# Patient Record
Sex: Female | Born: 1955 | Race: White | Hispanic: No | Marital: Married | Smoking: Never smoker
Health system: Southern US, Community
[De-identification: ages and names within clinical notes are randomized; demographics above are authoritative.]

## PROBLEM LIST (undated history)

## (undated) DIAGNOSIS — E119 Type 2 diabetes mellitus without complications: Secondary | ICD-10-CM

## (undated) DIAGNOSIS — E785 Hyperlipidemia, unspecified: Secondary | ICD-10-CM

## (undated) DIAGNOSIS — G709 Myoneural disorder, unspecified: Secondary | ICD-10-CM

## (undated) DIAGNOSIS — C801 Malignant (primary) neoplasm, unspecified: Secondary | ICD-10-CM

## (undated) HISTORY — PX: MASTECTOMY WITH AXILLARY LYMPH NODE DISSECTION: SHX5661

---

## 2004-12-11 ENCOUNTER — Ambulatory Visit: Payer: Self-pay | Admitting: Family Medicine

## 2005-01-31 ENCOUNTER — Ambulatory Visit: Payer: Self-pay | Admitting: Unknown Physician Specialty

## 2005-10-07 ENCOUNTER — Ambulatory Visit: Payer: Self-pay | Admitting: Family Medicine

## 2006-06-24 HISTORY — PX: MASTECTOMY: SHX3

## 2009-04-28 ENCOUNTER — Emergency Department: Payer: Self-pay | Admitting: Internal Medicine

## 2010-02-15 ENCOUNTER — Ambulatory Visit: Payer: Self-pay | Admitting: Unknown Physician Specialty

## 2015-02-09 ENCOUNTER — Encounter: Payer: Self-pay | Admitting: *Deleted

## 2015-02-10 ENCOUNTER — Ambulatory Visit
Admission: RE | Admit: 2015-02-10 | Discharge: 2015-02-10 | Disposition: A | Discharge status: Home / Self Care | Payer: Managed Care, Other (non HMO) | Source: Ambulatory Visit | Attending: Unknown Physician Specialty | Admitting: Unknown Physician Specialty

## 2015-02-10 ENCOUNTER — Ambulatory Visit: Payer: Managed Care, Other (non HMO) | Admitting: Anesthesiology

## 2015-02-10 ENCOUNTER — Encounter
Admission: RE | Disposition: A | Discharge status: Home / Self Care | Payer: Self-pay | Source: Ambulatory Visit | Attending: Unknown Physician Specialty

## 2015-02-10 ENCOUNTER — Encounter: Payer: Self-pay | Admitting: Anesthesiology

## 2015-02-10 DIAGNOSIS — E785 Hyperlipidemia, unspecified: Secondary | ICD-10-CM | POA: Insufficient documentation

## 2015-02-10 DIAGNOSIS — Z8371 Family history of colonic polyps: Secondary | ICD-10-CM | POA: Diagnosis not present

## 2015-02-10 DIAGNOSIS — Z1211 Encounter for screening for malignant neoplasm of colon: Secondary | ICD-10-CM | POA: Insufficient documentation

## 2015-02-10 DIAGNOSIS — I252 Old myocardial infarction: Secondary | ICD-10-CM | POA: Insufficient documentation

## 2015-02-10 DIAGNOSIS — E119 Type 2 diabetes mellitus without complications: Secondary | ICD-10-CM | POA: Insufficient documentation

## 2015-02-10 DIAGNOSIS — K621 Rectal polyp: Secondary | ICD-10-CM | POA: Diagnosis not present

## 2015-02-10 DIAGNOSIS — Z859 Personal history of malignant neoplasm, unspecified: Secondary | ICD-10-CM | POA: Insufficient documentation

## 2015-02-10 DIAGNOSIS — G709 Myoneural disorder, unspecified: Secondary | ICD-10-CM | POA: Diagnosis not present

## 2015-02-10 DIAGNOSIS — D123 Benign neoplasm of transverse colon: Secondary | ICD-10-CM | POA: Insufficient documentation

## 2015-02-10 DIAGNOSIS — K640 First degree hemorrhoids: Secondary | ICD-10-CM | POA: Insufficient documentation

## 2015-02-10 HISTORY — DX: Hyperlipidemia, unspecified: E78.5

## 2015-02-10 HISTORY — PX: COLONOSCOPY WITH PROPOFOL: SHX5780

## 2015-02-10 HISTORY — DX: Malignant (primary) neoplasm, unspecified: C80.1

## 2015-02-10 HISTORY — DX: Type 2 diabetes mellitus without complications: E11.9

## 2015-02-10 HISTORY — DX: Myoneural disorder, unspecified: G70.9

## 2015-02-10 LAB — GLUCOSE, CAPILLARY: GLUCOSE-CAPILLARY: 99 mg/dL (ref 65–99)

## 2015-02-10 SURGERY — COLONOSCOPY WITH PROPOFOL
Anesthesia: General

## 2015-02-10 MED ORDER — SODIUM CHLORIDE 0.9 % IV SOLN
INTRAVENOUS | Status: DC
  Administered 2015-02-10: 1000 mL via INTRAVENOUS

## 2015-02-10 MED ORDER — PROPOFOL 10 MG/ML IV BOLUS
INTRAVENOUS | Status: DC | PRN
  Administered 2015-02-10: 80 mg via INTRAVENOUS
  Administered 2015-02-10: 30 mg via INTRAVENOUS

## 2015-02-10 MED ORDER — LIDOCAINE HCL (CARDIAC) 20 MG/ML IV SOLN
INTRAVENOUS | Status: DC | PRN
  Administered 2015-02-10: 20 mg via INTRAVENOUS

## 2015-02-10 MED ORDER — SODIUM CHLORIDE 0.9 % IV SOLN: INTRAVENOUS | Status: DC

## 2015-02-10 MED ORDER — PROPOFOL INFUSION 10 MG/ML OPTIME
INTRAVENOUS | Status: DC | PRN
  Administered 2015-02-10: 120 ug/kg/min via INTRAVENOUS

## 2015-02-10 NOTE — Transfer of Care (Signed)
Immediate Anesthesia Transfer of Care Note  Patient: Jessica Conner  Procedure(s) Performed: Procedure(s): COLONOSCOPY WITH PROPOFOL (N/A)  Patient Location: Endoscopy Unit  Anesthesia Type:General  Level of Consciousness: awake  Airway & Oxygen Therapy: Patient Spontanous Breathing and Patient connected to nasal cannula oxygen  Post-op Assessment: Report given to RN and Post -op Vital signs reviewed and stable  Post vital signs: Reviewed and stable  Last Vitals:  Filed Vitals:   02/10/15 0749  BP: 166/98  Pulse: 22  Temp: 37.3 C  Resp: 22    Complications: No apparent anesthesia complications

## 2015-02-10 NOTE — Op Note (Signed)
[facility] Gastroenterology Patient Name: Jessica Conner Procedure Date: 02/10/2015 8:14 AM MRN: [id number] Account #: [id number] Date of Birth: July 26, 1955 Admit Type: Outpatient Age: 59 Room: [facility] ENDO ROOM 1 Gender: Female Note Status: Finalized Procedure:         Colonoscopy Indications:       Colon cancer screening in patient at increased risk:                     Family history of colon polyps Providers:         Manya Silvas, MD Referring MD:      Lenard Simmer, MD (Referring MD) Medicines:         Propofol per Anesthesia Complications:     No immediate complications. Procedure:         Pre-Anesthesia Assessment:                    - After reviewing the risks and benefits, the patient was                     deemed in satisfactory condition to undergo the procedure.                    After obtaining informed consent, the colonoscope was                     passed under direct vision. Throughout the procedure, the                     patient's blood pressure, pulse, and oxygen saturations                     were monitored continuously. The Colonoscope was                     introduced through the anus and advanced to the the cecum,                     identified by appendiceal orifice and ileocecal valve. The                     colonoscopy was performed without difficulty. The patient                     tolerated the procedure well. The quality of the bowel                     preparation was good. Findings:      A diminutive polyp was found in the rectum. The polyp was sessile. The       polyp was removed with a jumbo cold forceps. Resection and retrieval       were complete.      Internal hemorrhoids were found during endoscopy. The hemorrhoids were       small and Grade I (internal hemorrhoids that do not prolapse).      The exam was otherwise without abnormality. Impression:        - One diminutive polyp in the rectum. Resected and                  retrieved.                    - Internal hemorrhoids.                    -  The examination was otherwise normal. Recommendation:    - Await pathology results. Manya Silvas, MD 02/10/2015 8:48:38 AM This report has been signed electronically. Number of Addenda: 0 Note Initiated On: 02/10/2015 8:14 AM Scope Withdrawal Time: 0 hours 8 minutes 57 seconds  Total Procedure Duration: 0 hours 14 minutes 34 seconds       [facility]

## 2015-02-10 NOTE — H&P (Signed)
   Primary Care Physician:  Lenard Simmer, MD Primary Gastroenterologist:  Dr. Vira Agar  Pre-Procedure History & Physical: HPI:  Jessica Conner is a 59 y.o. female is here for an colonoscopy.   Past Medical History  Diagnosis Date  . Diabetes mellitus without complication   . Hyperlipidemia   . Neuromuscular disorder   . Cancer     History reviewed. No pertinent past surgical history.  Prior to Admission medications   Medication Sig Start Date End Date Taking? Authorizing Provider  anastrozole (ARIMIDEX) 1 MG tablet Take 1 mg by mouth daily.   Yes Historical Provider, MD  gabapentin (NEURONTIN) 600 MG tablet Take 600 mg by mouth 2 (two) times daily.   Yes Historical Provider, MD  metFORMIN (GLUMETZA) 500 MG (MOD) 24 hr tablet Take 500 mg by mouth daily with breakfast.   Yes Historical Provider, MD  rosuvastatin (CRESTOR) 5 MG tablet Take 5 mg by mouth daily.   Yes Historical Provider, MD    Allergies as of 12/29/2014  . (Not on File)    History reviewed. No pertinent family history.  Social History   Social History  . Marital Status: Married    Spouse Name: N/A  . Number of Children: N/A  . Years of Education: N/A   Occupational History  . Not on file.   Social History Main Topics  . Smoking status: Not on file  . Smokeless tobacco: Not on file  . Alcohol Use: Not on file  . Drug Use: Not on file  . Sexual Activity: Not on file   Other Topics Concern  . Not on file   Social History Narrative    Review of Systems: See HPI, otherwise negative ROS  Physical Exam: BP 166/98 mmHg  Pulse 22  Temp(Src) 99.1 F (37.3 C) (Tympanic)  Resp 22  Ht 5\' 4"  (1.626 m)  Wt 83.915 kg (185 lb)  BMI 31.74 kg/m2  SpO2 98% General:   Alert,  pleasant and cooperative in NAD Head:  Normocephalic and atraumatic. Neck:  Supple; no masses or thyromegaly. Lungs:  Clear throughout to auscultation.    Heart:  Regular rate and rhythm. Abdomen:  Soft, nontender and  nondistended. Normal bowel sounds, without guarding, and without rebound.   Neurologic:  Alert and  oriented x4;  grossly normal neurologically.  Impression/Plan: Jessica Conner is here for an colonoscopy to be performed for FH colon polyp in brother  Risks, benefits, limitations, and alternatives regarding  colonoscopy have been reviewed with the patient.  Questions have been answered.  All parties agreeable.   Gaylyn Cheers, MD  02/10/2015, 8:24 AM

## 2015-02-10 NOTE — Anesthesia Preprocedure Evaluation (Addendum)
Anesthesia Evaluation  Patient identified by MRN, date of birth, ID band Patient awake    Reviewed: Allergy & Precautions, H&P , NPO status , Patient's Chart, lab work & pertinent test results  History of Anesthesia Complications Negative for: history of anesthetic complications  Airway Mallampati: III  TM Distance: >3 FB Neck ROM: limited    Dental  (+) Poor Dentition   Pulmonary neg pulmonary ROS,  breath sounds clear to auscultation  Pulmonary exam normal       Cardiovascular Exercise Tolerance: Good - Past MI Normal cardiovascular examRhythm:regular Rate:Normal     Neuro/Psych  Neuromuscular disease negative psych ROS   GI/Hepatic negative GI ROS, Neg liver ROS,   Endo/Other  diabetes, Type 2, Oral Hypoglycemic Agents  Renal/GU negative Renal ROS  negative genitourinary   Musculoskeletal   Abdominal   Peds  Hematology negative hematology ROS (+)   Anesthesia Other Findings Past Medical History:   Diabetes mellitus without complication                       Hyperlipidemia                                               Neuromuscular disorder                                       Cancer                                                       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   Dental Advisory Given  Plan Discussed  with: Anesthesiologist and Surgeon  Anesthesia Plan Comments:        Anesthesia Quick Evaluation

## 2015-02-10 NOTE — Anesthesia Postprocedure Evaluation (Signed)
  Anesthesia Post-op Note  Patient: Jessica Conner  Procedure(s) Performed: Procedure(s): COLONOSCOPY WITH PROPOFOL (N/A)  Anesthesia type:General  Patient location: PACU  Post pain: Pain level controlled  Post assessment: Post-op Vital signs reviewed, Patient's Cardiovascular Status Stable, Respiratory Function Stable, Patent Airway and No signs of Nausea or vomiting  Post vital signs: Reviewed and stable  Last Vitals:  Filed Vitals:   02/10/15 0915  BP: 124/81  Pulse: 68  Temp:   Resp: 16    Level of consciousness: awake, alert  and patient cooperative  Complications: No apparent anesthesia complications

## 2015-02-11 NOTE — Progress Notes (Signed)
Non-identifying voicemail.  No message left.

## 2015-02-13 ENCOUNTER — Encounter: Payer: Self-pay | Admitting: Unknown Physician Specialty

## 2015-02-13 LAB — SURGICAL PATHOLOGY

## 2015-04-06 ENCOUNTER — Encounter
Admission: RE | Admit: 2015-04-06 | Discharge: 2015-04-06 | Disposition: A | Discharge status: Home / Self Care | Payer: Managed Care, Other (non HMO) | Source: Ambulatory Visit | Attending: Specialist | Admitting: Specialist

## 2015-04-06 DIAGNOSIS — Z01818 Encounter for other preprocedural examination: Secondary | ICD-10-CM | POA: Insufficient documentation

## 2015-04-06 DIAGNOSIS — G5601 Carpal tunnel syndrome, right upper limb: Secondary | ICD-10-CM | POA: Diagnosis not present

## 2015-04-06 NOTE — Patient Instructions (Signed)
  Your procedure is scheduled on: Tuesday April 11, 2015. Report to Same Day Surgery. To find out your arrival time please call [phone] between 1PM - 3PM on Monday April 10, 2015.  Remember: Instructions that are not followed completely may result in serious medical risk, up to and including death, or upon the discretion of your surgeon and anesthesiologist your surgery may need to be rescheduled.    _x___ 1. Do not eat food or drink liquids after midnight. No gum chewing or hard candies.     ____ 2. No Alcohol for 24 hours before or after surgery.   ____ 3. Bring all medications with you on the day of surgery if instructed.    _x___ 4. Notify your doctor if there is any change in your medical condition     (cold, fever, infections).     Do not wear jewelry, make-up, hairpins, clips or nail polish.  Do not wear lotions, powders, or perfumes. You may wear deodorant.  Do not shave 48 hours prior to surgery. Men may shave face and neck.  Do not bring valuables to the hospital.    [facility] is not responsible for any belongings or valuables.               Contacts, dentures or bridgework may not be worn into surgery.  Leave your suitcase in the car. After surgery it may be brought to your room.  For patients admitted to the hospital, discharge time is determined by your treatment team.   Patients discharged the day of surgery will not be allowed to drive home.    Please read over the following fact sheets that you were given:   [facility] Preparing for Surgery  _x___ Take these medicines the morning of surgery with A SIP OF WATER:    1. anastrozole (ARIMIDEX)   2. gabapentin (NEURONTIN)    ____ Fleet Enema (as directed)   _x___ Use CHG Soap as directed  ____ Use inhalers on the day of surgery  _x___ Stop metformin 2 days prior to surgery, last dose on Saturday.    ____ Take 1/2 of usual insulin dose the night before surgery and none on the morning of surgery.    ____ Stop Coumadin/Plavix/aspirin on does not apply.  _x___ Stop Anti-inflammatories(Aleve) on now.  Tylenol can be taken for pain.   ____ Stop supplements until after surgery.    ____ Bring C-Pap to the hospital.

## 2015-04-10 DIAGNOSIS — E119 Type 2 diabetes mellitus without complications: Secondary | ICD-10-CM | POA: Diagnosis not present

## 2015-04-10 DIAGNOSIS — G5601 Carpal tunnel syndrome, right upper limb: Secondary | ICD-10-CM | POA: Diagnosis present

## 2015-04-10 DIAGNOSIS — Z7984 Long term (current) use of oral hypoglycemic drugs: Secondary | ICD-10-CM | POA: Diagnosis not present

## 2015-04-10 DIAGNOSIS — E785 Hyperlipidemia, unspecified: Secondary | ICD-10-CM | POA: Diagnosis not present

## 2015-04-11 ENCOUNTER — Encounter
Admission: RE | Disposition: A | Discharge status: Home / Self Care | Payer: Self-pay | Source: Ambulatory Visit | Attending: Specialist

## 2015-04-11 ENCOUNTER — Ambulatory Visit: Payer: Self-pay | Admitting: Specialist

## 2015-04-11 ENCOUNTER — Ambulatory Visit
Admission: RE | Admit: 2015-04-11 | Discharge: 2015-04-11 | Disposition: A | Discharge status: Home / Self Care | Payer: Managed Care, Other (non HMO) | Source: Ambulatory Visit | Attending: Specialist | Admitting: Specialist

## 2015-04-11 ENCOUNTER — Ambulatory Visit: Payer: Managed Care, Other (non HMO) | Admitting: Anesthesiology

## 2015-04-11 ENCOUNTER — Encounter: Payer: Self-pay | Admitting: *Deleted

## 2015-04-11 DIAGNOSIS — E785 Hyperlipidemia, unspecified: Secondary | ICD-10-CM | POA: Insufficient documentation

## 2015-04-11 DIAGNOSIS — G5601 Carpal tunnel syndrome, right upper limb: Secondary | ICD-10-CM | POA: Diagnosis not present

## 2015-04-11 DIAGNOSIS — Z7984 Long term (current) use of oral hypoglycemic drugs: Secondary | ICD-10-CM | POA: Insufficient documentation

## 2015-04-11 DIAGNOSIS — E119 Type 2 diabetes mellitus without complications: Secondary | ICD-10-CM | POA: Insufficient documentation

## 2015-04-11 HISTORY — PX: CARPAL TUNNEL RELEASE: SHX101

## 2015-04-11 LAB — GLUCOSE, CAPILLARY
Glucose-Capillary: 105 mg/dL — ABNORMAL HIGH (ref 65–99)
Glucose-Capillary: 109 mg/dL — ABNORMAL HIGH (ref 65–99)

## 2015-04-11 SURGERY — CARPAL TUNNEL RELEASE
Anesthesia: General | Site: Hand | Laterality: Right | Wound class: Clean

## 2015-04-11 MED ORDER — OXYCODONE HCL 5 MG PO TABS: 5.0000 mg | ORAL_TABLET | Freq: Once | ORAL | Status: DC | PRN

## 2015-04-11 MED ORDER — LIDOCAINE HCL (CARDIAC) 20 MG/ML IV SOLN
INTRAVENOUS | Status: DC | PRN
Start: 2015-04-11 — End: 2015-04-11
  Administered 2015-04-11: 100 mg via INTRAVENOUS

## 2015-04-11 MED ORDER — BUPIVACAINE HCL 0.5 % IJ SOLN
INTRAMUSCULAR | Status: DC | PRN
  Administered 2015-04-11: 6 mL

## 2015-04-11 MED ORDER — SODIUM CHLORIDE 0.9 % IV SOLN
INTRAVENOUS | Status: DC
Start: 2015-04-11 — End: 2015-04-11
  Administered 2015-04-11: 07:00:00 via INTRAVENOUS

## 2015-04-11 MED ORDER — FENTANYL CITRATE (PF) 100 MCG/2ML IJ SOLN
INTRAMUSCULAR | Status: DC | PRN
  Administered 2015-04-11 (×2): 25 ug via INTRAVENOUS

## 2015-04-11 MED ORDER — ACETAMINOPHEN 10 MG/ML IV SOLN
INTRAVENOUS | Status: AC
  Filled 2015-04-11: qty 100

## 2015-04-11 MED ORDER — MIDAZOLAM HCL 2 MG/2ML IJ SOLN
INTRAMUSCULAR | Status: DC | PRN
  Administered 2015-04-11: 2 mg via INTRAVENOUS

## 2015-04-11 MED ORDER — ONDANSETRON HCL 4 MG/2ML IJ SOLN
INTRAMUSCULAR | Status: DC | PRN
  Administered 2015-04-11: 4 mg via INTRAVENOUS

## 2015-04-11 MED ORDER — TRAMADOL HCL 50 MG PO TABS: 50.0000 mg | ORAL_TABLET | Freq: Four times a day (QID) | ORAL | Status: AC | PRN

## 2015-04-11 MED ORDER — PROPOFOL 10 MG/ML IV BOLUS
INTRAVENOUS | Status: DC | PRN
  Administered 2015-04-11: 180 mg via INTRAVENOUS

## 2015-04-11 MED ORDER — ACETAMINOPHEN 10 MG/ML IV SOLN
INTRAVENOUS | Status: DC | PRN
  Administered 2015-04-11: 1000 mg via INTRAVENOUS

## 2015-04-11 MED ORDER — FAMOTIDINE 20 MG PO TABS
20.0000 mg | ORAL_TABLET | Freq: Once | ORAL | Status: AC
  Administered 2015-04-11: 20 mg via ORAL

## 2015-04-11 MED ORDER — OXYCODONE HCL 5 MG/5ML PO SOLN: 5.0000 mg | Freq: Once | ORAL | Status: DC | PRN

## 2015-04-11 MED ORDER — PHENYLEPHRINE HCL 10 MG/ML IJ SOLN
INTRAMUSCULAR | Status: DC | PRN
  Administered 2015-04-11 (×2): 100 ug via INTRAVENOUS

## 2015-04-11 MED ORDER — FAMOTIDINE 20 MG PO TABS
ORAL_TABLET | ORAL | Status: AC
  Administered 2015-04-11: 20 mg via ORAL
  Filled 2015-04-11: qty 1

## 2015-04-11 MED ORDER — FENTANYL CITRATE (PF) 100 MCG/2ML IJ SOLN: 25.0000 ug | INTRAMUSCULAR | Status: DC | PRN

## 2015-04-11 MED ORDER — BUPIVACAINE HCL (PF) 0.5 % IJ SOLN
INTRAMUSCULAR | Status: AC
  Filled 2015-04-11: qty 30

## 2015-04-11 SURGICAL SUPPLY — 22 items
BANDAGE ELASTIC 3 CLIP NS LF (GAUZE/BANDAGES/DRESSINGS) ×3 IMPLANT
BNDG ESMARK 4X12 TAN STRL LF (GAUZE/BANDAGES/DRESSINGS) ×3 IMPLANT
CANISTER SUCT 1200ML W/VALVE (MISCELLANEOUS) ×3 IMPLANT
CAST PADDING 3X4FT ST 30246 (SOFTGOODS) ×4
CHLORAPREP W/TINT 26ML (MISCELLANEOUS) ×3 IMPLANT
GAUZE PETRO XEROFOAM 1X8 (MISCELLANEOUS) ×3 IMPLANT
GAUZE SPONGE 4X4 12PLY STRL (GAUZE/BANDAGES/DRESSINGS) ×3 IMPLANT
GLOVE BIO SURGEON STRL SZ7.5 (GLOVE) ×3 IMPLANT
GOWN STRL REUS W/ TWL LRG LVL3 (GOWN DISPOSABLE) ×2 IMPLANT
GOWN STRL REUS W/TWL LRG LVL3 (GOWN DISPOSABLE) ×4
KIT RM TURNOVER STRD PROC AR (KITS) ×3 IMPLANT
NS IRRIG 500ML POUR BTL (IV SOLUTION) ×3 IMPLANT
PACK EXTREMITY ARMC (MISCELLANEOUS) ×3 IMPLANT
PAD CAST CTTN 3X4 STRL (SOFTGOODS) ×2 IMPLANT
PAD GROUND ADULT SPLIT (MISCELLANEOUS) ×3 IMPLANT
PAD PREP 24X41 OB/GYN DISP (PERSONAL CARE ITEMS) ×3 IMPLANT
PADDING CAST 3IN STRL (MISCELLANEOUS) ×2
PADDING CAST BLEND 3X4 STRL (MISCELLANEOUS) ×1 IMPLANT
STOCKINETTE STRL 4IN 9604848 (GAUZE/BANDAGES/DRESSINGS) ×3 IMPLANT
SUT ETHILON 4-0 (SUTURE) ×2
SUT ETHILON 4-0 FS2 18XMFL BLK (SUTURE) ×1
SUTURE ETHLN 4-0 FS2 18XMF BLK (SUTURE) ×1 IMPLANT

## 2015-04-11 NOTE — Op Note (Signed)
NAME:  Jessica Conner, Jessica Conner NO.:  [id number]  MEDICAL RECORD NO.:  [id number]  LOCATION:  [facility]                         FACILITY:  [facility]  PHYSICIAN:  Margaretmary Eddy, MD        DATE OF BIRTH:  10/30/1955  DATE OF PROCEDURE:  04/11/2015 DATE OF DISCHARGE:                              OPERATIVE REPORT   PREOPERATIVE DIAGNOSIS:  Right carpal tunnel syndrome.  POSTOPERATIVE DIAGNOSIS:  Right carpal tunnel syndrome.  PROCEDURE:  Right carpal tunnel release.  SURGEON:  Margaretmary Eddy, M.D.  ANESTHESIA:  General.  COMPLICATIONS:  None.  TOURNIQUET TIME:  11 minute.  DESCRIPTION OF PROCEDURE:  After adequate induction of general anesthesia, the right upper extremity was thoroughly prepped with alcohol and ChloraPrep and draped in standard sterile fashion. Extremity was wrapped out with the Esmarch bandage and pneumatic tourniquet elevated to 200 mmHg.  Under loupe magnification, standard volar carpal tunnel incision was made and dissection carried down to the transverse retinacular ligament.  This was incised in the midportion using the knife.  The distal release was performed with the small scissors.  The proximal release is performed with the small scissors and the carpal tunnel scissors.  Careful check is made both proximally and distally to ensure the complete release had been obtained, there is seen to be moderate compression of the nerve directly beneath the ligament. The wound is thoroughly irrigated multiple times.  Skin edges were infiltrated with 0.5% plain Marcaine.  Skin was closed with 4-0 nylon. Soft bulky dressing is applied.  Tourniquet was released and was immediately removed from the arm.  The patient is returned to the recovery room in satisfactory condition having tolerated the procedure quite well.          ______________________________ Margaretmary Eddy, MD     CS/MEDQ  D:  04/11/2015  T:  04/11/2015  Job:  [id number]

## 2015-04-11 NOTE — Discharge Instructions (Signed)
Elevate arm at all times May remove entire bandage in 24 hours, bathe, get wet, etc. Cover wound with bandaid, wear brace prn. AMBULATORY SURGERY  DISCHARGE INSTRUCTIONS   1) The drugs that you were given will stay in your system until tomorrow so for the next 24 hours you should not:  A) Drive an automobile B) Make any legal decisions C) Drink any alcoholic beverage   2) You may resume regular meals tomorrow.  Today it is better to start with liquids and gradually work up to solid foods.  You may eat anything you prefer, but it is better to start with liquids, then soup and crackers, and gradually work up to solid foods.   3) Please notify your doctor immediately if you have any unusual bleeding, trouble breathing, redness and pain at the surgery site, drainage, fever, or pain not relieved by medication.    4) Additional Instructions:  Ice for comfort Please contact your physician with any problems or Same Day Surgery at [phone], Monday through Friday 6 am to 4 pm, or [place] at [facility] number at [phone].

## 2015-04-11 NOTE — H&P (Signed)
  59 year old female with right carpal tunnel syndrome.  History and physical exam has been inserted into the record as a paper document.  Heart and lungs clear.  ENT normal.  Plan: right carpal tunnel release.

## 2015-04-11 NOTE — Addendum Note (Signed)
Addendum  created 04/11/15 1338 by Demetrius Charity, CRNA   Modules edited: Anesthesia Medication Administration

## 2015-04-11 NOTE — Anesthesia Postprocedure Evaluation (Signed)
  Anesthesia Post-op Note  Patient: Jessica Conner  Procedure(s) Performed: Procedure(s): CARPAL TUNNEL RELEASE (Right)  Anesthesia type:General LMA  Patient location: PACU  Post pain: Pain level controlled  Post assessment: Post-op Vital signs reviewed, Patient's Cardiovascular Status Stable, Respiratory Function Stable, Patent Airway and No signs of Nausea or vomiting  Post vital signs: Reviewed and stable  Last Vitals:  Filed Vitals:   04/11/15 0920  BP: 126/74  Pulse: 77  Temp:   Resp: 16    Level of consciousness: awake, alert  and patient cooperative  Complications: No apparent anesthesia complications

## 2015-04-11 NOTE — Brief Op Note (Signed)
04/11/2015  8:13 AM  PATIENT:  Weston Settle  59 y.o. female  PRE-OPERATIVE DIAGNOSIS:  CARPAL TUNNEL SYNDROME right hand  POST-OPERATIVE DIAGNOSIS:  carpal tunnel syndrome right hand  PROCEDURE:  Procedure(s): CARPAL TUNNEL RELEASE (Right)  SURGEON:  Surgeon(s) and Role:    * Christophe Louis, MD - Primary  PHYSICIAN ASSISTANT:   ASSISTANTS: none   ANESTHESIA:   general  EBL:  Total I/O In: 400 [I.V.:400] Out: -   BLOOD ADMINISTERED:none  DRAINS: none   LOCAL MEDICATIONS USED:  MARCAINE     SPECIMEN:  No Specimen  DISPOSITION OF SPECIMEN:  N/A  COUNTS:  YES  TOURNIQUET:    DICTATION: .Dragon Dictation  PLAN OF CARE: Discharge to home after PACU  PATIENT DISPOSITION:  PACU - hemodynamically stable.   Delay start of Pharmacological VTE agent (>24hrs) due to surgical blood loss or risk of bleeding: not applicable

## 2015-04-11 NOTE — Anesthesia Preprocedure Evaluation (Signed)
Anesthesia Evaluation  Patient identified by MRN, date of birth, ID band Patient awake    Reviewed: Allergy & Precautions, H&P , NPO status , Patient's Chart, lab work & pertinent test results  History of Anesthesia Complications Negative for: history of anesthetic complications  Airway Mallampati: III  TM Distance: >3 FB Neck ROM: limited    Dental  (+) Poor Dentition   Pulmonary neg pulmonary ROS,    Pulmonary exam normal breath sounds clear to auscultation       Cardiovascular Exercise Tolerance: Good (-) Past MI Normal cardiovascular exam Rhythm:regular Rate:Normal     Neuro/Psych  Neuromuscular disease negative psych ROS   GI/Hepatic negative GI ROS, Neg liver ROS, neg GERD  ,  Endo/Other  diabetes, Type 2, Oral Hypoglycemic Agents  Renal/GU negative Renal ROS  negative genitourinary   Musculoskeletal   Abdominal   Peds  Hematology negative hematology ROS (+)   Anesthesia Other Findings Past Medical History:   Diabetes mellitus without complication                       Hyperlipidemia                                               Neuromuscular disorder                                       Cancer                                                      BMI    Body Mass Index   32.59 kg/m 2     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   Dental Advisory Given  Plan Discussed with: Anesthesiologist, CRNA and Surgeon  Anesthesia Plan Comments:         Anesthesia Quick Evaluation

## 2015-04-11 NOTE — Transfer of Care (Signed)
Immediate Anesthesia Transfer of Care Note  Patient: Jessica Conner  Procedure(s) Performed: Procedure(s): CARPAL TUNNEL RELEASE (Right)  Patient Location: PACU  Anesthesia Type:General  Level of Consciousness: sedated  Airway & Oxygen Therapy: Patient Spontanous Breathing and Patient connected to face mask oxygen  Post-op Assessment: Report given to RN and Post -op Vital signs reviewed and stable  Post vital signs: Reviewed and stable  Last Vitals:  Filed Vitals:   04/11/15 0624  BP: 137/84  Pulse: 86  Temp: 36.9 C  Resp: 16    Complications: No apparent anesthesia complications

## 2016-07-17 ENCOUNTER — Other Ambulatory Visit: Payer: Self-pay | Admitting: Obstetrics & Gynecology

## 2016-07-17 DIAGNOSIS — Z13820 Encounter for screening for osteoporosis: Secondary | ICD-10-CM

## 2016-07-17 DIAGNOSIS — Z1231 Encounter for screening mammogram for malignant neoplasm of breast: Secondary | ICD-10-CM

## 2016-09-04 ENCOUNTER — Telehealth: Payer: Self-pay | Admitting: Obstetrics & Gynecology

## 2016-09-04 NOTE — Telephone Encounter (Signed)
Patient returned the call, has been on vacation. went online and filled out the consent and faxed to [facility] today, images should arrive in 5-10 days. I asked if she had the images sent to [facility] or [facility], and she said [facility]. She said she would fill out another form to send the images to [facility]. Also, the patient needs a prescription for another mastectomy prosthesis.

## 2016-09-04 NOTE — Telephone Encounter (Signed)
Ive never done this, not sure the process.

## 2016-09-05 NOTE — Telephone Encounter (Signed)
Rx written as cannot seem to do in EPIC

## 2016-09-05 NOTE — Telephone Encounter (Signed)
Pt aware rx ready for pick up. 

## 2016-09-05 NOTE — Telephone Encounter (Signed)
Pt states she received last rx from [facility] but since she is no longer going there and her current prosthesis is too small she needs a generic prescription for right mastectomy prosthesis to give to supply store. They will measure and bill insurance for supplies. Pt would like to pick up rx at front desk, call when ready. Please include diagnosis code on rx. Pt aware [facility] out of the office until Monday. Thank you.

## 2016-09-18 ENCOUNTER — Telehealth: Payer: Self-pay | Admitting: Obstetrics & Gynecology

## 2016-09-18 ENCOUNTER — Other Ambulatory Visit: Payer: Self-pay | Admitting: *Deleted

## 2016-09-18 ENCOUNTER — Inpatient Hospital Stay
Admission: RE | Admit: 2016-09-18 | Discharge: 2016-09-18 | Disposition: A | Discharge status: Home / Self Care | Payer: Self-pay | Source: Ambulatory Visit | Attending: *Deleted | Admitting: *Deleted

## 2016-09-18 ENCOUNTER — Other Ambulatory Visit: Payer: Self-pay | Admitting: Obstetrics & Gynecology

## 2016-09-18 DIAGNOSIS — Z1231 Encounter for screening mammogram for malignant neoplasm of breast: Secondary | ICD-10-CM

## 2016-09-18 NOTE — Telephone Encounter (Signed)
Patient is aware [facility] received her prior images, and she is scheduled for Monday, 12/16/16 @ 1:40pm for mammo and DEXA. Patient requested 3D and Melissa at [facility] has been notified.

## 2016-12-16 ENCOUNTER — Other Ambulatory Visit: Payer: Managed Care, Other (non HMO)

## 2017-01-29 ENCOUNTER — Ambulatory Visit
Admission: RE | Admit: 2017-01-29 | Discharge: 2017-01-29 | Disposition: A | Discharge status: Home / Self Care | Payer: BLUE CROSS/BLUE SHIELD | Source: Ambulatory Visit | Attending: Obstetrics & Gynecology | Admitting: Obstetrics & Gynecology

## 2017-01-29 DIAGNOSIS — Z1231 Encounter for screening mammogram for malignant neoplasm of breast: Secondary | ICD-10-CM | POA: Diagnosis present

## 2017-01-30 ENCOUNTER — Encounter: Payer: Self-pay | Admitting: Obstetrics & Gynecology

## 2017-03-11 ENCOUNTER — Ambulatory Visit: Payer: Managed Care, Other (non HMO)

## 2017-03-11 ENCOUNTER — Ambulatory Visit
Admission: RE | Admit: 2017-03-11 | Discharge: 2017-03-11 | Disposition: A | Discharge status: Home / Self Care | Payer: BLUE CROSS/BLUE SHIELD | Source: Ambulatory Visit | Attending: Obstetrics & Gynecology | Admitting: Obstetrics & Gynecology

## 2017-03-11 DIAGNOSIS — M810 Age-related osteoporosis without current pathological fracture: Secondary | ICD-10-CM | POA: Insufficient documentation

## 2017-03-11 DIAGNOSIS — Z13820 Encounter for screening for osteoporosis: Secondary | ICD-10-CM | POA: Diagnosis present

## 2017-03-11 NOTE — Progress Notes (Signed)
Sch appt to discuss DEXA results

## 2017-03-27 ENCOUNTER — Ambulatory Visit (INDEPENDENT_AMBULATORY_CARE_PROVIDER_SITE_OTHER): Payer: BLUE CROSS/BLUE SHIELD | Admitting: Obstetrics & Gynecology

## 2017-03-27 ENCOUNTER — Encounter: Payer: Self-pay | Admitting: Obstetrics & Gynecology

## 2017-03-27 VITALS — BP 130/80 | HR 90 | Ht 64.0 in | Wt 185.0 lb

## 2017-03-27 DIAGNOSIS — M810 Age-related osteoporosis without current pathological fracture: Secondary | ICD-10-CM | POA: Diagnosis not present

## 2017-03-27 MED ORDER — RALOXIFENE HCL 60 MG PO TABS: 60.0000 mg | ORAL_TABLET | Freq: Every day | ORAL | 11 refills | Status: DC

## 2017-03-27 MED ORDER — CHLORPHEN-PE-ACETAMINOPHEN 4-10-325 MG PO TABS: 1.0000 | ORAL_TABLET | Freq: Four times a day (QID) | ORAL | 2 refills | Status: AC | PRN

## 2017-03-27 NOTE — Progress Notes (Signed)
SUBJECTIVE:   Jessica Conner is a 61 y.o. female who presents in consultation for osteoporosis.   OSTEOPOROSIS risk factors (non-modifiable) Personal Hx of fracture as an adult: no Hx of fracture in first-degree relative: yes Caucasian race: yes Advanced age: yes female sex: yes Dementia: no Poor health/frailty:no   OSTEOPOROSIS risk factors (potentially modifiable): Tobacco use: no Low body weight (<127 lbs): no Estrogen deficiency    early menopause (age <45) or bilateral ovariectomy: no    prolonged premenopausal amenorrhea (>1 yr): no Low calcium intake (lifelong): no Alcoholism: no Recurrent falls: no Inadequate physical activity: no  Current calcium and Vit D intake: Dietary sources: diet supplements: pills     OBJECTIVE:    No exam performed today, discussion of DEXA.  Results of prior DEXA:Yes - -3.6 OP   ASSESSMENT:    Osteoporosis      PLAN:    (Note: The National Osteoporosis Foundation recommends pharmacological treatment  of all postmenopausal women with T-scores below -2.0, and those with T-scores below -1.5  with risk factors for osteoporosis. Osteoporosis is present when the T-score is below -2.5. The T-score is defined as the number of standard deviations above or below the average BMD value for young, healthy white women.)  Is pharmacological therapy indicated? yes Which medication is selected: reloxifine (EVISTA) Which dosing regimen is selected: daily    A. Contraindications to bisphosphonates: hypocalcemia, esophageal abnormalities, inability to remain upright for at least 30 min. post dose, renal insufficiency. Contraindications reviewed: yes.  B. Patients on bisphosphonates should also receive adequate calcium and vitamin D supplementation. Patient advised regarding calcium and vitamin D supplements: yes. Patient advised not to take these supplements at same time as bisphosphonate due to inhibition of absorption: yes.  C. Contraindications  to SERM therapy: history of blood clots, may increase hot flashes and cause leg cramping.    A total of 15 minutes were spent face-to-face with the patient during this encounter and over half of that time dealt with counseling and coordination of care.  Barnett Applebaum, MD, [facility], [facility] 03/27/2017  11:42 AM

## 2017-03-27 NOTE — Patient Instructions (Signed)
Raloxifene tablets What is this medicine? RALOXIFENE (ral OX i feen) reduces the amount of calcium lost from bones. It is used to treat and prevent osteoporosis in women who have experienced menopause. It may also help prevent invasive breast cancer in certain women who have a high risk for breast cancer. This medicine may be used for other purposes; ask your health care provider or pharmacist if you have questions. COMMON BRAND NAME(S): Evista What should I tell my health care provider before I take this medicine? They need to know if you have any of these conditions: -a history of blood clots -cancer -heart disease or recent heart attack -high levels of triglycerides (blood fat) in the blood -history of stroke -kidney disease -liver disease -premenopausal -smoke tobacco -an unusual or allergic reaction to raloxifene, other medicines, foods, dyes, or preservatives -pregnant or trying to get pregnant -breast-feeding How should I use this medicine? Take this medicine by mouth with a glass of water. Follow the directions on the prescription label. The tablets can be taken with or without food. Take your doses at regular intervals. Do not take your medicine more often than directed. A special MedGuide will be given to you by the pharmacist with each prescription and refill. Be sure to read this information carefully each time. Talk to your pediatrician regarding the use of this medicine in children. Special care may be needed. Overdosage: If you think you have taken too much of this medicine contact a poison control center or emergency room at once. NOTE: This medicine is only for you. Do not share this medicine with others. What if I miss a dose? If you miss a dose, take it as soon as you can. If it is almost time for your next dose, take only that dose. Do not take double or extra doses. What may interact with this medicine? -cholestyramine -female hormones, like  estrogens -warfarin This list may not describe all possible interactions. Give your health care provider a list of all the medicines, herbs, non-prescription drugs, or dietary supplements you use. Also tell them if you smoke, drink alcohol, or use illegal drugs. Some items may interact with your medicine. What should I watch for while using this medicine? Visit your doctor or health care professional for regular checks on your progress. Do not stop taking this medicine except on the advice of your doctor or health care professional. If you are taking this medicine to reduce your risk of getting breast cancer, you should know that this medicine does not prevent all types of breast cancer. Talk to your doctor if you have questions. This medicine does not prevent hot flashes. It may cause hot flashes in some patients at the start of therapy. You should make sure that you get enough calcium and vitamin D while you are taking this medicine. Discuss the foods you eat and the vitamins you take with your health care professional. Exercise may help to prevent bone loss. Discuss your exercise needs with your doctor or health care professional. This medicine can rarely cause blood clots. If you are going to have surgery, tell your doctor or health care professional that you are taking this medicine. This medicine should be stopped at least 3 days before surgery. After surgery, it should be restarted only after you are walking again. It should not be restarted while you still need long periods of bed rest. You should not smoke while taking this medicine. Smoking may increase your risk of blood clots or stroke.  If you have any reason to think you are pregnant; stop taking this medicine at once and contact your doctor or health care professional. Do not breast feed while taking this medicine. What side effects may I notice from receiving this medicine? Side effects that you should report to your doctor or health  care professional as soon as possible: -allergic reactions like skin rash, itching or hives, swelling of the face, lips, or tongue) -breast tissue changes or discharge -signs and symptoms of a blood clot such as breathing problems; changes in vision; chest pain; severe, sudden headache; pain, swelling, warmth in the leg; trouble speaking; sudden numbness or weakness of the face, arm or leg -signs and symptoms of a stroke like changes in vision; confusion; trouble speaking or understanding; severe headaches; sudden numbness or weakness of the face, arm or leg; trouble walking; dizziness; loss of balance or coordination -vaginal discharge that is bloody, brown, or rust Side effects that usually do not require medical attention (report to your doctor or health care professional if they continue or are bothersome): -hot flashes -joint pain -leg cramps -sweating -swelling of the ankles, feet, hands This list may not describe all possible side effects. Call your doctor for medical advice about side effects. You may report side effects to FDA at 1-800-FDA-1088. Where should I keep my medicine? Keep out of the reach of children. Store at room temperature between 15 and 30 degrees C (59 and 86 degrees F). Throw away any unused medicine after the expiration date. NOTE: This sheet is a summary. It may not cover all possible information. If you have questions about this medicine, talk to your doctor, pharmacist, or health care provider.  2018 [employer] (2016-07-17 17:15:34)  

## 2017-04-17 ENCOUNTER — Ambulatory Visit: Payer: Self-pay | Admitting: Obstetrics & Gynecology

## 2018-02-16 ENCOUNTER — Telehealth: Payer: Self-pay | Admitting: Obstetrics & Gynecology

## 2018-02-17 NOTE — Telephone Encounter (Signed)
Sch Annual/follow up to osteoporosis medication

## 2018-02-17 NOTE — Telephone Encounter (Signed)
Patient has moved to [place]. Patient wants to express much gratitude for all he has done for her.

## 2018-03-05 ENCOUNTER — Telehealth: Payer: Self-pay

## 2018-03-05 NOTE — Telephone Encounter (Signed)
Pt in [place] at a doctor's office needing her T-Score.  Results given and hardcopy will be sent once we receive ROI.
